# Patient Record
Sex: Female | Born: 1940 | Race: Black or African American | Hispanic: No | Marital: Married | Smoking: Never smoker
Health system: Southern US, Community
[De-identification: ages and names within clinical notes are randomized; demographics above are authoritative.]

## PROBLEM LIST (undated history)

## (undated) DIAGNOSIS — T7840XA Allergy, unspecified, initial encounter: Secondary | ICD-10-CM

## (undated) DIAGNOSIS — E785 Hyperlipidemia, unspecified: Secondary | ICD-10-CM

## (undated) DIAGNOSIS — I10 Essential (primary) hypertension: Secondary | ICD-10-CM

## (undated) DIAGNOSIS — J45909 Unspecified asthma, uncomplicated: Secondary | ICD-10-CM

---

## 2011-04-27 DIAGNOSIS — M810 Age-related osteoporosis without current pathological fracture: Secondary | ICD-10-CM | POA: Insufficient documentation

## 2013-04-26 DIAGNOSIS — L680 Hirsutism: Secondary | ICD-10-CM | POA: Insufficient documentation

## 2013-08-13 ENCOUNTER — Emergency Department: Payer: Self-pay | Admitting: Emergency Medicine

## 2013-08-13 LAB — CBC
MCH: 31.6 pg (ref 26.0–34.0)
MCHC: 33.6 g/dL (ref 32.0–36.0)
Platelet: 256 10*3/uL (ref 150–440)
RBC: 4.34 10*6/uL (ref 3.80–5.20)
WBC: 6.7 10*3/uL (ref 3.6–11.0)

## 2013-08-13 LAB — BASIC METABOLIC PANEL
Anion Gap: 4 — ABNORMAL LOW (ref 7–16)
BUN: 5 mg/dL — ABNORMAL LOW (ref 7–18)
Calcium, Total: 8.8 mg/dL (ref 8.5–10.1)
Chloride: 103 mmol/L (ref 98–107)
Creatinine: 0.68 mg/dL (ref 0.60–1.30)
EGFR (African American): >60
EGFR (Non-African Amer.): >60
Glucose: 113 mg/dL — ABNORMAL HIGH (ref 65–99)
Potassium: 3.4 mmol/L — ABNORMAL LOW (ref 3.5–5.1)
Sodium: 136 mmol/L (ref 136–145)

## 2013-08-13 IMAGING — CR DG CHEST 2V
1 series · 2 of 2 positions shown · non-contrast
Comparison: None.

CLINICAL DATA: Short of breath.  Wheezing.  History of asthma.

EXAM:
CHEST  2 VIEW

[Series 2: w chest pa · 0.14mm/px · 2 of 2 slices shown]
[im 1/2]
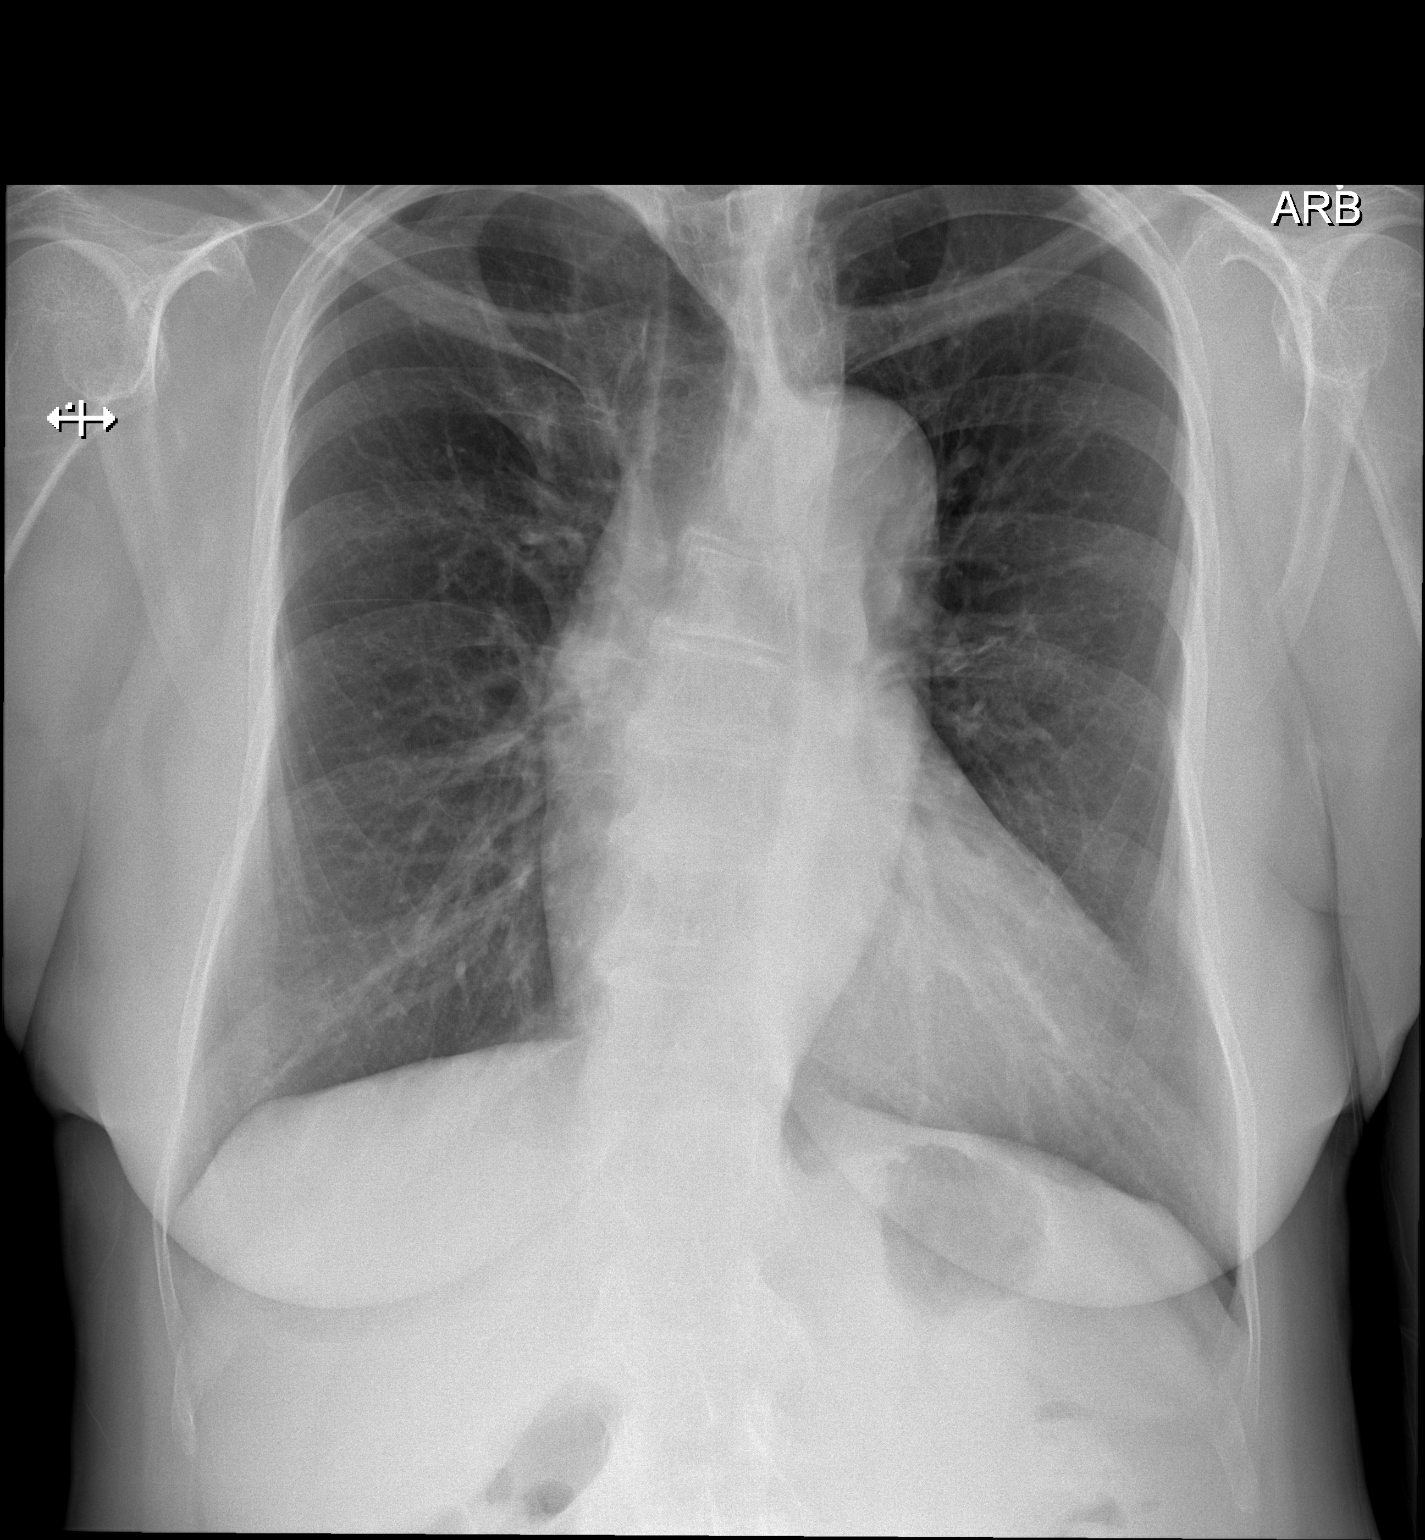
[im 2/2]
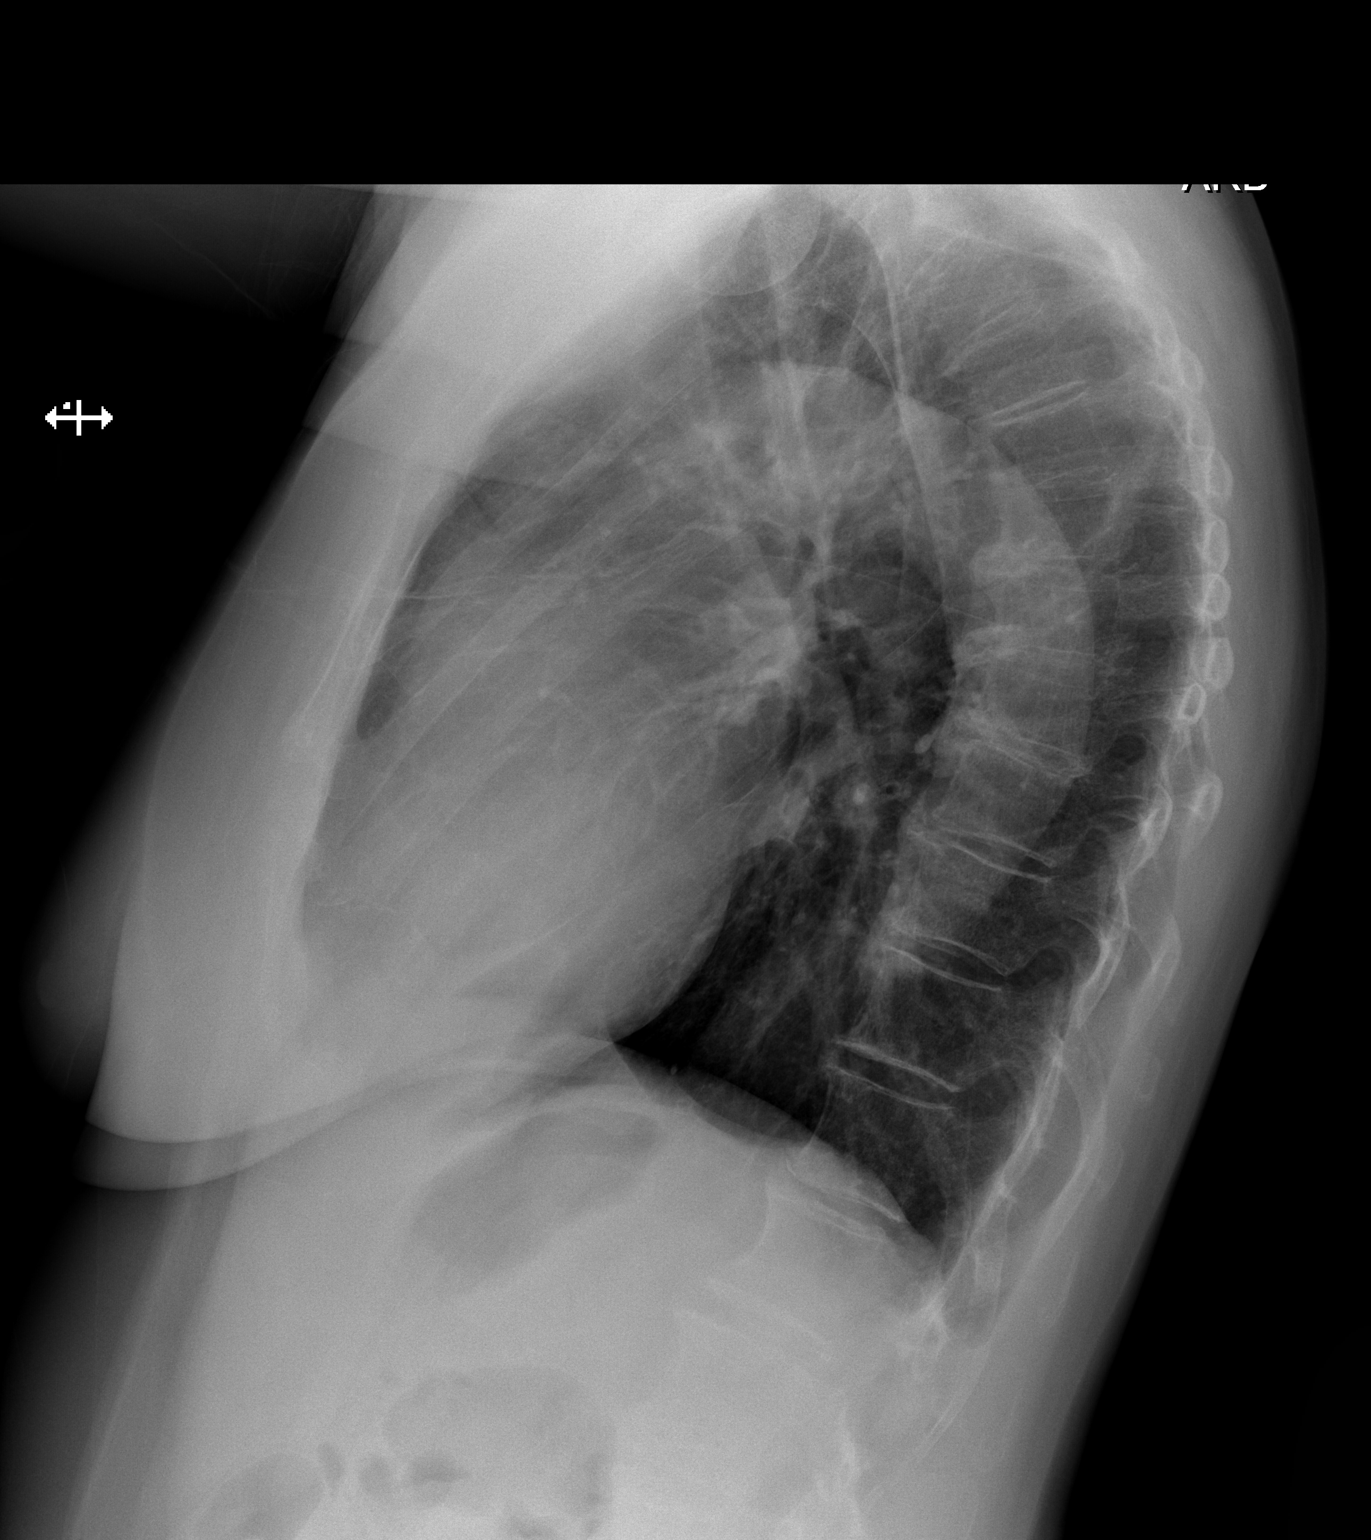

[2 of 2 positions shown; findings below may reference images not displayed]

FINDINGS: Cardiac silhouette is normal in size. The aorta is mildly uncoiled.
No mediastinal or hilar masses.

Lungs are mildly hyperexpanded but clear. No pleural effusion or
pneumothorax.

The bony thorax is demineralized but grossly intact.
IMPRESSION: No active cardiopulmonary disease.

## 2014-04-03 DIAGNOSIS — K219 Gastro-esophageal reflux disease without esophagitis: Secondary | ICD-10-CM | POA: Insufficient documentation

## 2014-04-12 DIAGNOSIS — D126 Benign neoplasm of colon, unspecified: Secondary | ICD-10-CM | POA: Insufficient documentation

## 2014-09-25 DIAGNOSIS — R1314 Dysphagia, pharyngoesophageal phase: Secondary | ICD-10-CM | POA: Insufficient documentation

## 2021-09-26 ENCOUNTER — Ambulatory Visit
Admission: EM | Admit: 2021-09-26 | Discharge: 2021-09-26 | Disposition: A | Discharge status: Home / Self Care | Payer: Medicare HMO

## 2021-09-26 ENCOUNTER — Other Ambulatory Visit: Payer: Self-pay

## 2021-09-26 DIAGNOSIS — S61209A Unspecified open wound of unspecified finger without damage to nail, initial encounter: Secondary | ICD-10-CM

## 2021-09-26 DIAGNOSIS — S61204A Unspecified open wound of right ring finger without damage to nail, initial encounter: Secondary | ICD-10-CM

## 2021-09-26 HISTORY — DX: Unspecified asthma, uncomplicated: J45.909

## 2021-09-26 HISTORY — DX: Essential (primary) hypertension: I10

## 2021-09-26 HISTORY — DX: Allergy, unspecified, initial encounter: T78.40XA

## 2021-09-26 HISTORY — DX: Hyperlipidemia, unspecified: E78.5

## 2021-09-26 MED ORDER — DOXYCYCLINE HYCLATE 100 MG PO CAPS: 100.0000 mg | ORAL_CAPSULE | Freq: Two times a day (BID) | ORAL | 0 refills | Status: AC

## 2021-09-26 NOTE — Discharge Instructions (Signed)
Take the Doxycycline twice daily for 7 days to prevent infection of your wound.  Keep the area clean and dry, and open to the air while at home.  You can cover your finger tip with a band aid when going out in public.  When preparing food you can use a finger cot, available at the pharmacy to cover your finger and prevent contamination.  Return for re-evaluation if you develop swelling, drainage, red streaks going up your hand, or a fever.

## 2021-09-26 NOTE — ED Triage Notes (Signed)
Patient is here for "laceation, finger, right hand, 4th digit". DOI: Monday, [id number]. Concerned with this "recurrently bleeding when bandage is off". "Cutting cabbage with Mandelin and cut finger".

## 2021-09-26 NOTE — ED Provider Notes (Signed)
[facility]    CSN: [id number] Arrival date & time: 09/26/21  1232      History   Chief Complaint Chief Complaint  Patient presents with   Laceration    HPI Beverly Wise is a 81 y.o. female.   HPI  81 year old female here for evaluation of laceration.  Patient reports that 4 days ago she was cutting cabbage with a mandolin when she cut the tip of her right fourth finger.  She came in today because she is concerned that it keeps bleeding every time she takes the bandage off.  She denies any redness around the wound, red streaks going up her hand, fever, or numbness or tingling.  She states that she does have a significant amount of tenderness at the site of injury.  Past Medical History:  Diagnosis Date   Allergies    Asthma    Hyperlipidemia    Hypertension     There are no problems to display for this patient.   History reviewed. No pertinent surgical history.  OB History   No obstetric history on file.      Home Medications    Prior to Admission medications   Medication Sig Start Date End Date Taking? Authorizing Provider  amLODipine (NORVASC) 5 MG tablet Take by mouth. 09/18/14  Yes [provider]  atorvastatin (LIPITOR) 40 MG tablet Take by mouth. 08/24/18 10/02/21 Yes [provider]  Cholecalciferol (VITAMIN D3) 10 MCG (400 UNIT) tablet Take by mouth.   Yes [provider]  doxycycline (VIBRAMYCIN) 100 MG capsule Take 1 capsule (100 mg total) by mouth 2 (two) times daily for 7 days. 09/26/21 10/03/21 Yes Becky Augusta, NP  fluticasone [facility]) 50 MCG/ACT nasal spray Place into the nose. 09/05/14  Yes [provider]  ibandronate (BONIVA) 150 MG tablet Take 150 mg by mouth every 30 (thirty) days. 07/17/21  Yes [provider]  losartan (COZAAR) 100 MG tablet Take by mouth. 09/18/14  Yes [provider]  albuterol (VENTOLIN HFA) 108 (90 Base) MCG/ACT inhaler Inhale into the lungs. 09/05/14    [provider]  ascorbic acid (VITAMIN C) 1000 MG tablet Take by mouth.    [provider]  clobetasol ointment (TEMOVATE) 0.05 % APPLY TOPICALLY THIN LAYER TWICE DAILY TO ACTIVE ITCHING ON SCALP FOR UP TO TWO WEEKS 09/01/18   [provider]  Coenzyme Q10 100 MG TABS Take by mouth.    [provider]  colchicine 0.6 MG tablet Take 2 tablets (1.2mg ) by mouth at first sign of gout flare followed by 1 tablet (0.6mg ) after 1 hour. (Max 1.8mg  within 1 hour) 03/13/13   [provider]  famotidine (PEPCID) 20 MG tablet Take by mouth. 08/24/18   [provider]  fluticasone-salmeterol (ADVAIR) 250-50 MCG/ACT AEPB Inhale into the lungs. 06/30/13   [provider]  ipratropium-albuterol (DUONEB) 0.5-2.5 (3) MG/3ML SOLN Inhale into the lungs. 09/19/18   [provider]  Lifitegrast 5 % SOLN Apply to eye. 03/07/19   [provider]  Omega-3 Fatty Acids (FISH OIL PO) Take by mouth.    [provider]  traZODone (DESYREL) 50 MG tablet Take by mouth. 08/24/18   [provider]  Vitamins-Lipotropics (COMPLEX B-100-INOSITOL) TBCR Take by mouth.    [provider]    Family History No family history on file.  Social History Social History   Tobacco Use   Smoking status: Never    Passive exposure: Never   Smokeless tobacco:  Never  Vaping Use   Vaping Use: Never used  Substance Use Topics   Alcohol use: Not Currently   Drug use: Never     Allergies   Misc. sulfonamide containing compounds, Simvastatin, Aspirin, Caffeine, Clarithromycin, and Diltiazem   Review of Systems Review of Systems  Constitutional:  Negative for fever.  Skin:  Positive for wound. Negative for color change.  Neurological:  Negative for weakness and numbness.  Hematological: Negative.   Psychiatric/Behavioral: Negative.      Physical Exam Triage Vital Signs ED Triage Vitals  Enc Vitals Group     BP 09/26/21 1256 (!)  121/93     Pulse Rate 09/26/21 1256 70     Resp 09/26/21 1256 18     Temp 09/26/21 1256 98.1 F (36.7 C)     Temp Source 09/26/21 1256 Oral     SpO2 09/26/21 1256 99 %     Weight 09/26/21 1256 140 lb (63.5 kg)     Height 09/26/21 1256 5\' 5"  (1.651 m)     Head Circumference --      Peak Flow --      Pain Score 09/26/21 1255 2     Pain Loc --      Pain Edu? --      Excl. in GC? --    No data found.  Updated Vital Signs BP (!) 121/93 (BP Location: Left Arm) Comment: Initial   Pulse 70    Temp 98.1 F (36.7 C) (Oral)    Resp 18    Ht 5\' 5"  (1.651 m)    Wt 140 lb (63.5 kg)    LMP  (LMP Unknown)    SpO2 99%    BMI 23.30 kg/m   Visual Acuity Right Eye Distance:   Left Eye Distance:   Bilateral Distance:    Right Eye Near:   Left Eye Near:    Bilateral Near:     Physical Exam Vitals and nursing note reviewed.  Constitutional:      General: She is not in acute distress.    Appearance: Normal appearance. She is not ill-appearing.  HENT:     Head: Normocephalic and atraumatic.  Musculoskeletal:        General: Tenderness and signs of injury present. No swelling or deformity. Normal range of motion.  Skin:    General: Skin is warm and dry.     Capillary Refill: Capillary refill takes less than 2 seconds.     Findings: No erythema.  Neurological:     General: No focal deficit present.     Mental Status: She is alert and oriented to person, place, and time.  Psychiatric:        Mood and Affect: Mood normal.        Behavior: Behavior normal.        Thought Content: Thought content normal.        Judgment: Judgment normal.     UC Treatments / Results  Labs (all labs ordered are listed, but only abnormal results are displayed) Labs Reviewed - No data to display  EKG   Radiology No results found.  Procedures Procedures (including critical care time)  Medications Ordered in UC Medications - No data to display  Initial Impression / Assessment and Plan / UC Course   I have reviewed the triage vital signs and the nursing notes.  Pertinent labs & imaging results that were available during my care of the patient were reviewed by me and considered in  my medical decision making (see chart for details).  Patient is a very pleasant, nontoxic-appearing 81 year old female here for evaluation of an injury she sustained to the tip of her right fourth finger 4 days ago when using a mandolin to slice cabbage.  She states that she is concerned because it keeps bleeding every time she removes the bandage.  She also complains of the area being very tender.  She has not had any fever or noticed any red streaks ascending her hand.  She also denies any swelling at the fingertip.  The patient has been keeping the finger dressed with Band-Aids and gauze.  After the patient removed the bandage there is a readily identifiable avulsed tissue defect approximately 5 mm in diameter and circular in appearance.  There is a pink wound bed but no active bleeding from the site.  There is no surrounding erythema or edema of the distal phalanx and no red streaks ascending the fingers.  Patient has full range of motion of her fingers as well.  Exam is consistent with a tissue avulsion.  I have recommended the patient that she leave the area open to air so that a scab conform when she is at home and only covered with a Band-Aid when she goes out in public.  Her tetanus shot is up-to-date.  I will place her on empiric doxycycline to prevent infection.  I have advised the patient to return if she develops any swelling of the fingertip, increased redness, drainage, swelling of her finger, red streaks ascending her hand, or fever.   Final Clinical Impressions(s) / UC Diagnoses   Final diagnoses:  Avulsion of finger tip, initial encounter     Discharge Instructions      Take the Doxycycline twice daily for 7 days to prevent infection of your wound.  Keep the area clean and dry, and open to the air  while at home.  You can cover your finger tip with a band aid when going out in public.  When preparing food you can use a finger cot, available at the pharmacy to cover your finger and prevent contamination.  Return for re-evaluation if you develop swelling, drainage, red streaks going up your hand, or a fever.     ED Prescriptions     Medication Sig Dispense Auth. Provider   doxycycline (VIBRAMYCIN) 100 MG capsule Take 1 capsule (100 mg total) by mouth 2 (two) times daily for 7 days. 14 capsule Becky Augusta, NP      PDMP not reviewed this encounter.   Becky Augusta, NP 09/26/21 1523

## 2022-10-15 DIAGNOSIS — N952 Postmenopausal atrophic vaginitis: Secondary | ICD-10-CM | POA: Insufficient documentation

## 2022-10-15 DIAGNOSIS — N8111 Cystocele, midline: Secondary | ICD-10-CM | POA: Insufficient documentation

## 2023-01-20 ENCOUNTER — Encounter: Payer: Self-pay | Admitting: Podiatry

## 2023-01-20 ENCOUNTER — Ambulatory Visit: Payer: Medicare HMO | Admitting: Podiatry

## 2023-01-20 DIAGNOSIS — E559 Vitamin D deficiency, unspecified: Secondary | ICD-10-CM | POA: Insufficient documentation

## 2023-01-20 DIAGNOSIS — J309 Allergic rhinitis, unspecified: Secondary | ICD-10-CM | POA: Insufficient documentation

## 2023-01-20 DIAGNOSIS — K635 Polyp of colon: Secondary | ICD-10-CM | POA: Insufficient documentation

## 2023-01-20 DIAGNOSIS — E785 Hyperlipidemia, unspecified: Secondary | ICD-10-CM | POA: Insufficient documentation

## 2023-01-20 DIAGNOSIS — J4520 Mild intermittent asthma, uncomplicated: Secondary | ICD-10-CM | POA: Insufficient documentation

## 2023-01-20 DIAGNOSIS — F5104 Psychophysiologic insomnia: Secondary | ICD-10-CM | POA: Insufficient documentation

## 2023-01-20 DIAGNOSIS — M109 Gout, unspecified: Secondary | ICD-10-CM | POA: Insufficient documentation

## 2023-01-20 DIAGNOSIS — M25562 Pain in left knee: Secondary | ICD-10-CM | POA: Insufficient documentation

## 2023-01-20 DIAGNOSIS — L600 Ingrowing nail: Secondary | ICD-10-CM

## 2023-01-20 DIAGNOSIS — I10 Essential (primary) hypertension: Secondary | ICD-10-CM | POA: Insufficient documentation

## 2023-01-20 MED ORDER — NEOMYCIN-POLYMYXIN-HC 1 % OT SOLN: OTIC | 1 refills | Status: AC

## 2023-01-20 NOTE — Progress Notes (Signed)
Subjective:  Patient ID: Beverly Wise, female    DOB: 05-21-1941,  MRN: [id number] HPI Chief Complaint  Patient presents with   Toe Pain    Hallux right - medial border, tender x 2 weeks, husband tried to trim-no help, also concerned if she has a fungus   New Patient (Initial Visit)    82 y.o. female presents with the above complaint.   ROS: Denies fever chills nausea vomit muscle aches pains calf pain back pain chest pain shortness of breath.  Past Medical History:  Diagnosis Date   Allergies    Asthma    Hyperlipidemia    Hypertension    No past surgical history on file.  Current Outpatient Medications:    estradiol (ESTRACE) 0.1 MG/GM vaginal cream, Place vaginally., Disp: , Rfl:    NEOMYCIN-POLYMYXIN-HYDROCORTISONE (CORTISPORIN) 1 % SOLN OTIC solution, Apply 1-2 drops to toe BID after soaking, Disp: 10 mL, Rfl: 1   albuterol (VENTOLIN HFA) 108 (90 Base) MCG/ACT inhaler, Inhale into the lungs., Disp: , Rfl:    amLODipine (NORVASC) 5 MG tablet, Take by mouth., Disp: , Rfl:    ascorbic acid (VITAMIN C) 1000 MG tablet, Take by mouth., Disp: , Rfl:    atorvastatin (LIPITOR) 40 MG tablet, Take by mouth., Disp: , Rfl:    Cholecalciferol (VITAMIN D3) 10 MCG (400 UNIT) tablet, Take by mouth., Disp: , Rfl:    clobetasol ointment (TEMOVATE) 0.05 %, APPLY TOPICALLY THIN LAYER TWICE DAILY TO ACTIVE ITCHING ON SCALP FOR UP TO TWO WEEKS, Disp: , Rfl:    Coenzyme Q10 100 MG TABS, Take by mouth., Disp: , Rfl:    losartan (COZAAR) 100 MG tablet, Take by mouth., Disp: , Rfl:    Omega-3 Fatty Acids (FISH OIL PO), Take by mouth., Disp: , Rfl:    Vitamins-Lipotropics (COMPLEX B-100-INOSITOL) TBCR, Take by mouth., Disp: , Rfl:   Allergies  Allergen Reactions   Misc. Sulfonamide Containing Compounds Hives    Other reaction(s): Other (See Comments), Other (See Comments) Unable to specify  Unable to specify     Simvastatin Other (See Comments)    Muscle weakness Other Reaction: OTHER  REACTION Muscle weakness Other Reaction: OTHER REACTION Muscle weakness    Aspirin Palpitations   Caffeine Palpitations   Clarithromycin Nausea Only and Other (See Comments)    Other reaction(s): Other (See Comments) Other Reaction: GI UPSET Other Reaction: GI UPSET    Diltiazem Palpitations   Review of Systems Objective:  There were no vitals filed for this visit.  General: Well developed, nourished, in no acute distress, alert and oriented x3   Dermatological: Skin is warm, dry and supple bilateral. Nails x 10 are well maintained; remaining integument appears unremarkable at this time. There are no open sores, no preulcerative lesions, no rash or signs of infection present.  Ingrown toenail sharp incurvated nail margin along the medial border of the right hallux.  States has been bothersome for about 2 weeks.  Has not noticed any drainage.    Vascular: Dorsalis Pedis artery and Posterior Tibial artery pedal pulses are 2/4 bilateral with immedate capillary fill time. Pedal hair growth present. No varicosities and no lower extremity edema present bilateral.   Neruologic: Grossly intact via light touch bilateral. Vibratory intact via tuning fork bilateral. Protective threshold with Semmes Wienstein monofilament intact to all pedal sites bilateral. Patellar and Achilles deep tendon reflexes 2+ bilateral. No Babinski or clonus noted bilateral.   Musculoskeletal: No gross boney pedal deformities bilateral. No pain, crepitus,  or limitation noted with foot and ankle range of motion bilateral. Muscular strength 5/5 in all groups tested bilateral.  Gait: Unassisted, Nonantalgic.    Radiographs:  None taken  Assessment & Plan:   Assessment: Ingrown toenail hallux right  Plan: Chemical matricectomy was performed today after local anesthetic was administered patient tolerated procedure well.  Was given both oral written home-going instructions of care and soaking of the toe as well as a  prescription for Cortisporin Otic to be applied twice daily after soaking.  Will follow-up with me in 2 weeks     Maribell Demeo T. [place], [place]

## 2023-01-20 NOTE — Patient Instructions (Signed)

## 2023-02-01 ENCOUNTER — Ambulatory Visit: Payer: Self-pay | Admitting: Podiatry

## 2023-02-03 ENCOUNTER — Encounter: Payer: Self-pay | Admitting: Podiatry

## 2023-02-03 ENCOUNTER — Ambulatory Visit (INDEPENDENT_AMBULATORY_CARE_PROVIDER_SITE_OTHER): Payer: Medicare HMO | Admitting: Podiatry

## 2023-02-03 DIAGNOSIS — Z9889 Other specified postprocedural states: Secondary | ICD-10-CM

## 2023-02-03 DIAGNOSIS — L600 Ingrowing nail: Secondary | ICD-10-CM

## 2023-02-03 NOTE — Progress Notes (Signed)
She presents today for follow-up of his matrixectomy right foot.  Denies fever chills nausea vomit states that it Betadine was a little too much so she started soaking in white vinegar and water.  Objective: Vital signs are stable alert and oriented x 3 there is no erythema Dem salines drainage or odor matricectomy appears to be healing very nicely.  Assessment: Well-healing matrixectomy.  Plan: Continue to soak once daily then follow-up with me on an as-needed basis.

## 2024-08-11 ENCOUNTER — Other Ambulatory Visit: Payer: Self-pay | Admitting: Internal Medicine

## 2024-08-11 DIAGNOSIS — R011 Cardiac murmur, unspecified: Secondary | ICD-10-CM

## 2024-08-11 DIAGNOSIS — R079 Chest pain, unspecified: Secondary | ICD-10-CM

## 2024-09-01 ENCOUNTER — Telehealth (HOSPITAL_COMMUNITY): Payer: Self-pay | Admitting: *Deleted

## 2024-09-01 NOTE — Telephone Encounter (Signed)
 Reaching out to patient to offer assistance regarding upcoming cardiac imaging study; pt verbalizes understanding of appt date/time, parking situation and where to check in, pre-test NPO status and medications ordered, and verified current allergies; name and call back number provided for further questions should they arise  Chantal Requena RN Navigator Cardiac Imaging [facility] [phone] office [phone] cell

## 2024-09-04 ENCOUNTER — Ambulatory Visit
Admission: RE | Admit: 2024-09-04 | Discharge: 2024-09-04 | Disposition: A | Discharge status: Home / Self Care | Source: Ambulatory Visit | Attending: Internal Medicine | Admitting: Internal Medicine

## 2024-09-04 ENCOUNTER — Ambulatory Visit
Admission: RE | Admit: 2024-09-04 | Discharge: 2024-09-04 | Disposition: A | Source: Ambulatory Visit | Attending: Cardiology

## 2024-09-04 ENCOUNTER — Other Ambulatory Visit: Payer: Self-pay | Admitting: Cardiology

## 2024-09-04 DIAGNOSIS — R079 Chest pain, unspecified: Secondary | ICD-10-CM | POA: Diagnosis present

## 2024-09-04 DIAGNOSIS — I7121 Aneurysm of the ascending aorta, without rupture: Secondary | ICD-10-CM | POA: Insufficient documentation

## 2024-09-04 DIAGNOSIS — R931 Abnormal findings on diagnostic imaging of heart and coronary circulation: Secondary | ICD-10-CM | POA: Diagnosis not present

## 2024-09-04 DIAGNOSIS — I25118 Atherosclerotic heart disease of native coronary artery with other forms of angina pectoris: Secondary | ICD-10-CM | POA: Diagnosis present

## 2024-09-04 DIAGNOSIS — R011 Cardiac murmur, unspecified: Secondary | ICD-10-CM | POA: Insufficient documentation

## 2024-09-04 DIAGNOSIS — I700 Atherosclerosis of aorta: Secondary | ICD-10-CM | POA: Insufficient documentation

## 2024-09-04 MED ORDER — METOPROLOL TARTRATE 5 MG/5ML IV SOLN: 10.0000 mg | Freq: Once | INTRAVENOUS | Status: DC | PRN

## 2024-09-04 MED ORDER — IOHEXOL 350 MG/ML SOLN
100.0000 mL | Freq: Once | INTRAVENOUS | Status: AC | PRN
  Administered 2024-09-04: 100 mL via INTRAVENOUS

## 2024-09-04 MED ORDER — NITROGLYCERIN 0.4 MG SL SUBL
0.8000 mg | SUBLINGUAL_TABLET | Freq: Once | SUBLINGUAL | Status: AC
  Administered 2024-09-04: 0.8 mg via SUBLINGUAL
  Filled 2024-09-04: qty 25
